# Patient Record
Sex: Female | Born: 1955 | Race: White | Hispanic: No | Marital: Married | Smoking: Former smoker
Health system: Southern US, Community
[De-identification: ages and names within clinical notes are randomized; demographics above are authoritative.]

## PROBLEM LIST (undated history)

## (undated) DIAGNOSIS — I252 Old myocardial infarction: Secondary | ICD-10-CM

## (undated) DIAGNOSIS — F419 Anxiety disorder, unspecified: Secondary | ICD-10-CM

## (undated) DIAGNOSIS — N289 Disorder of kidney and ureter, unspecified: Secondary | ICD-10-CM

## (undated) HISTORY — PX: LITHOTRIPSY: SUR834

## (undated) HISTORY — PX: OTHER SURGICAL HISTORY: SHX169

## (undated) HISTORY — PX: CORONARY ANGIOPLASTY WITH STENT PLACEMENT: SHX49

---

## 2014-02-26 ENCOUNTER — Encounter (HOSPITAL_COMMUNITY): Payer: Self-pay | Admitting: *Deleted

## 2014-02-26 ENCOUNTER — Inpatient Hospital Stay (HOSPITAL_COMMUNITY)
Admission: EM | Admit: 2014-02-26 | Discharge: 2014-03-02 | DRG: 193 | Disposition: A | Discharge status: Home / Self Care | Payer: 59 | Attending: Family Medicine | Admitting: Family Medicine

## 2014-02-26 ENCOUNTER — Emergency Department (HOSPITAL_COMMUNITY): Payer: 59

## 2014-02-26 ENCOUNTER — Inpatient Hospital Stay (HOSPITAL_COMMUNITY): Payer: 59

## 2014-02-26 DIAGNOSIS — F419 Anxiety disorder, unspecified: Secondary | ICD-10-CM | POA: Diagnosis present

## 2014-02-26 DIAGNOSIS — R112 Nausea with vomiting, unspecified: Secondary | ICD-10-CM | POA: Diagnosis present

## 2014-02-26 DIAGNOSIS — Z87891 Personal history of nicotine dependence: Secondary | ICD-10-CM | POA: Diagnosis not present

## 2014-02-26 DIAGNOSIS — Z87442 Personal history of urinary calculi: Secondary | ICD-10-CM

## 2014-02-26 DIAGNOSIS — I252 Old myocardial infarction: Secondary | ICD-10-CM | POA: Diagnosis not present

## 2014-02-26 DIAGNOSIS — J9601 Acute respiratory failure with hypoxia: Secondary | ICD-10-CM | POA: Diagnosis present

## 2014-02-26 DIAGNOSIS — R05 Cough: Secondary | ICD-10-CM

## 2014-02-26 DIAGNOSIS — E876 Hypokalemia: Secondary | ICD-10-CM

## 2014-02-26 DIAGNOSIS — E860 Dehydration: Secondary | ICD-10-CM | POA: Diagnosis present

## 2014-02-26 DIAGNOSIS — J189 Pneumonia, unspecified organism: Secondary | ICD-10-CM | POA: Diagnosis present

## 2014-02-26 DIAGNOSIS — H109 Unspecified conjunctivitis: Secondary | ICD-10-CM

## 2014-02-26 DIAGNOSIS — R059 Cough, unspecified: Secondary | ICD-10-CM

## 2014-02-26 HISTORY — DX: Disorder of kidney and ureter, unspecified: N28.9

## 2014-02-26 HISTORY — DX: Anxiety disorder, unspecified: F41.9

## 2014-02-26 HISTORY — DX: Old myocardial infarction: I25.2

## 2014-02-26 LAB — CBC WITH DIFFERENTIAL/PLATELET
BASOS ABS: 0.1 10*3/uL (ref 0.0–0.1)
BASOS PCT: 1 % (ref 0–1)
EOS ABS: 0 10*3/uL (ref 0.0–0.7)
Eosinophils Relative: 0 % (ref 0–5)
HEMATOCRIT: 42.5 % (ref 36.0–46.0)
Hemoglobin: 13.8 g/dL (ref 12.0–15.0)
Lymphocytes Relative: 11 % — ABNORMAL LOW (ref 12–46)
Lymphs Abs: 1.2 10*3/uL (ref 0.7–4.0)
MCH: 28.5 pg (ref 26.0–34.0)
MCHC: 32.5 g/dL (ref 30.0–36.0)
MCV: 87.8 fL (ref 78.0–100.0)
MONOS PCT: 19 % — AB (ref 3–12)
Monocytes Absolute: 2 10*3/uL — ABNORMAL HIGH (ref 0.1–1.0)
NEUTROS ABS: 7.3 10*3/uL (ref 1.7–7.7)
Neutrophils Relative %: 69 % (ref 43–77)
PLATELETS: 275 10*3/uL (ref 150–400)
RBC: 4.84 MIL/uL (ref 3.87–5.11)
RDW: 13 % (ref 11.5–15.5)
WBC Morphology: INCREASED
WBC: 10.6 10*3/uL — AB (ref 4.0–10.5)

## 2014-02-26 LAB — COMPREHENSIVE METABOLIC PANEL
ALBUMIN: 3.2 g/dL — AB (ref 3.5–5.2)
ALT: 42 U/L — ABNORMAL HIGH (ref 0–35)
AST: 33 U/L (ref 0–37)
Alkaline Phosphatase: 116 U/L (ref 39–117)
Anion gap: 10 (ref 5–15)
BUN: 9 mg/dL (ref 6–23)
CO2: 28 mmol/L (ref 19–32)
CREATININE: 0.82 mg/dL (ref 0.50–1.10)
Calcium: 8.8 mg/dL (ref 8.4–10.5)
Chloride: 102 mEq/L (ref 96–112)
GFR, EST NON AFRICAN AMERICAN: 78 mL/min — AB (ref >90)
GLUCOSE: 118 mg/dL — AB (ref 70–99)
POTASSIUM: 3 mmol/L — AB (ref 3.5–5.1)
Sodium: 140 mmol/L (ref 135–145)
Total Bilirubin: 0.5 mg/dL (ref 0.3–1.2)
Total Protein: 7.7 g/dL (ref 6.0–8.3)

## 2014-02-26 LAB — LIPASE, BLOOD: Lipase: 19 U/L (ref 11–59)

## 2014-02-26 MED ORDER — DEXTROSE 5 % IV SOLN: 500.0000 mg | Freq: Once | INTRAVENOUS | Status: DC

## 2014-02-26 MED ORDER — SODIUM CHLORIDE 0.9 % IV SOLN
INTRAVENOUS | Status: DC
  Administered 2014-02-26: 22:00:00 via INTRAVENOUS

## 2014-02-26 MED ORDER — DULOXETINE HCL 60 MG PO CPEP
60.0000 mg | ORAL_CAPSULE | Freq: Every day | ORAL | Status: DC
  Administered 2014-02-27 – 2014-03-02 (×4): 60 mg via ORAL
  Filled 2014-02-26 (×4): qty 1

## 2014-02-26 MED ORDER — IOHEXOL 300 MG/ML  SOLN
80.0000 mL | Freq: Once | INTRAMUSCULAR | Status: AC | PRN
  Administered 2014-02-26: 80 mL via INTRAVENOUS

## 2014-02-26 MED ORDER — ACETAMINOPHEN 325 MG PO TABS
650.0000 mg | ORAL_TABLET | Freq: Four times a day (QID) | ORAL | Status: DC | PRN
  Administered 2014-03-02: 650 mg via ORAL
  Filled 2014-02-26: qty 2

## 2014-02-26 MED ORDER — PROMETHAZINE HCL 25 MG RE SUPP: 25.0000 mg | Freq: Three times a day (TID) | RECTAL | Status: DC | PRN

## 2014-02-26 MED ORDER — POTASSIUM CHLORIDE 10 MEQ/100ML IV SOLN
10.0000 meq | INTRAVENOUS | Status: AC
  Administered 2014-02-26 – 2014-02-27 (×3): 10 meq via INTRAVENOUS
  Filled 2014-02-26 (×3): qty 100

## 2014-02-26 MED ORDER — TRAZODONE HCL 50 MG PO TABS
100.0000 mg | ORAL_TABLET | Freq: Every day | ORAL | Status: DC
  Administered 2014-02-27 – 2014-03-01 (×3): 100 mg via ORAL
  Filled 2014-02-26 (×4): qty 2

## 2014-02-26 MED ORDER — DEXTROSE 5 % IV SOLN: 1.0000 g | INTRAVENOUS | Status: DC

## 2014-02-26 MED ORDER — BUDESONIDE 0.25 MG/2ML IN SUSP
0.2500 mg | Freq: Every day | RESPIRATORY_TRACT | Status: DC
Start: 2014-02-27 — End: 2014-02-28
  Administered 2014-02-28: 0.25 mg via RESPIRATORY_TRACT
  Filled 2014-02-26 (×4): qty 2

## 2014-02-26 MED ORDER — SODIUM CHLORIDE 0.9 % IV BOLUS (SEPSIS)
1000.0000 mL | Freq: Once | INTRAVENOUS | Status: AC
  Administered 2014-02-26: 1000 mL via INTRAVENOUS

## 2014-02-26 MED ORDER — DEXTROSE 5 % IV SOLN
1.0000 g | Freq: Once | INTRAVENOUS | Status: AC
  Administered 2014-02-26: 1 g via INTRAVENOUS
  Filled 2014-02-26: qty 10

## 2014-02-26 MED ORDER — FLUTICASONE PROPIONATE HFA 110 MCG/ACT IN AERO: 2.0000 | INHALATION_SPRAY | Freq: Every day | RESPIRATORY_TRACT | Status: DC

## 2014-02-26 MED ORDER — ENSURE COMPLETE PO LIQD
237.0000 mL | Freq: Two times a day (BID) | ORAL | Status: DC
  Administered 2014-03-02: 237 mL via ORAL

## 2014-02-26 MED ORDER — DEXTROSE 5 % IV SOLN
500.0000 mg | INTRAVENOUS | Status: DC
  Administered 2014-02-27 – 2014-03-01 (×4): 500 mg via INTRAVENOUS
  Filled 2014-02-26 (×4): qty 500

## 2014-02-26 MED ORDER — SIMVASTATIN 20 MG PO TABS
40.0000 mg | ORAL_TABLET | Freq: Every day | ORAL | Status: DC
  Administered 2014-02-27 – 2014-03-01 (×3): 40 mg via ORAL
  Filled 2014-02-26 (×4): qty 2

## 2014-02-26 MED ORDER — ACETAMINOPHEN 650 MG RE SUPP
650.0000 mg | Freq: Once | RECTAL | Status: AC
  Administered 2014-02-26: 650 mg via RECTAL
  Filled 2014-02-26: qty 1

## 2014-02-26 MED ORDER — ONDANSETRON HCL 4 MG/2ML IJ SOLN
4.0000 mg | Freq: Once | INTRAMUSCULAR | Status: AC
  Administered 2014-02-26: 4 mg via INTRAVENOUS
  Filled 2014-02-26: qty 2

## 2014-02-26 MED ORDER — DULOXETINE HCL 60 MG PO CPEP: 60.0000 mg | ORAL_CAPSULE | Freq: Every day | ORAL | Status: DC

## 2014-02-26 MED ORDER — SIMVASTATIN 20 MG PO TABS
40.0000 mg | ORAL_TABLET | Freq: Every day | ORAL | Status: DC
  Administered 2014-02-27: 40 mg via ORAL

## 2014-02-26 MED ORDER — BUPROPION HCL ER (XL) 300 MG PO TB24
300.0000 mg | ORAL_TABLET | Freq: Every day | ORAL | Status: DC
  Administered 2014-02-27 – 2014-03-02 (×4): 300 mg via ORAL
  Filled 2014-02-26 (×6): qty 1

## 2014-02-26 MED ORDER — CEFTRIAXONE SODIUM IN DEXTROSE 20 MG/ML IV SOLN
1.0000 g | INTRAVENOUS | Status: DC
  Administered 2014-02-27 – 2014-03-01 (×3): 1 g via INTRAVENOUS
  Filled 2014-02-26 (×4): qty 50

## 2014-02-26 MED ORDER — HEPARIN SODIUM (PORCINE) 5000 UNIT/ML IJ SOLN
5000.0000 [IU] | Freq: Three times a day (TID) | INTRAMUSCULAR | Status: DC
  Administered 2014-02-27 – 2014-03-02 (×12): 5000 [IU] via SUBCUTANEOUS
  Filled 2014-02-26 (×12): qty 1

## 2014-02-26 NOTE — ED Notes (Signed)
Dr Gosrani at bedside. 

## 2014-02-26 NOTE — ED Notes (Signed)
Pt's O2 sats dropped to 86%. Pt HOB raised and told to take deep breaths. O2 raised to 88%. Pt placed on 2L O2 via nasal cannula. Pt O2 at 93%. EDP made aware.

## 2014-02-26 NOTE — ED Notes (Signed)
MD at bedside. 

## 2014-02-26 NOTE — H&P (Signed)
[facility] History and Physical  [name] WUJ:[id number] DOB: 1955/12/03 DOA: 02/26/2014  Referring physician: ER PCP: No primary care provider on file.   Chief Complaint: Cough, weakness.  HPI: [name] is a 59 y.o. female  This is a 59 year old lady who gives a one-week history of cough productive of green/yellow sputum associated with fever. She also has had nausea and vomiting for the last few days without abdominal pain. She lives in [place] and is visiting her mother here. She says that her symptoms began with an upper respiratory tract viral type of illness. She does not feel dyspneic but she was noted to have desaturations on air with minimal exertion in the emergency room. She is an ex-smoker. Her chest x-ray is abnormal and she is now being admitted for further management.   Review of Systems:  Apart from symptoms above, all systems negative.   Past Medical History  Diagnosis Date  . MI, old   . Anxiety   . Renal disorder     kidney stones   Past Surgical History  Procedure Laterality Date  . Coronary angioplasty with stent placement    . Lithotripsy    . Kidney stone extraction     Social History:  reports that she has quit smoking. She does not have any smokeless tobacco history on file. She reports that she drinks alcohol. Her drug history is not on file.  Allergies  Allergen Reactions  . Erythromycin Nausea And Vomiting    Family history: No history of chronic lung disease in the family.    Prior to Admission medications   Medication Sig Start Date End Date Taking? Authorizing Provider  azithromycin (ZITHROMAX) 250 MG tablet Take 250-500 mg by mouth daily. Started on 02/25/14   Yes Historical Provider, MD  buPROPion (WELLBUTRIN XL) 300 MG 24 hr tablet Take 300 mg by mouth daily.   Yes Historical Provider, MD  DULoxetine (CYMBALTA) 60 MG capsule Take 60 mg by mouth daily.   Yes Historical Provider, MD  FLOVENT HFA 110 MCG/ACT inhaler Inhale 2  puffs into the lungs daily. 01/27/14  Yes Historical Provider, MD  promethazine (PHENERGAN) 25 MG suppository Place 25 mg rectally every 8 (eight) hours as needed for nausea or vomiting.   Yes Historical Provider, MD  simvastatin (ZOCOR) 40 MG tablet Take 40 mg by mouth daily.   Yes Historical Provider, MD  traZODone (DESYREL) 100 MG tablet Take 100 mg by mouth at bedtime.   Yes Historical Provider, MD   Physical Exam: Filed Vitals:   02/26/14 1716 02/26/14 1850 02/26/14 2011 02/26/14 2030  BP: 165/85  146/83 148/86  Pulse: 110  110 113  Temp: 102 F (38.9 Ryan) 100.6 F (38.1 Ryan) 98.6 F (37 Ryan) 98.5 F (36.9 Ryan)  TempSrc: Oral Oral Oral Oral  Resp: Height:  (1.626 m)     Weight: 68.04 kg (150 lb)     SpO2: 97%  86% 93%    Wt Readings from Last 3 Encounters:  02/26/14 68.04 kg (150 lb)    General:  Appears calm and comfortable. Anxious. Flushed with fever.  Eyes: PERRL, normal lids, irises & conjunctiva ENT: grossly normal hearing, lips & tongue Neck: no LAD, masses or thyromegaly Cardiovascular: RRR, no m/r/g. No LE edema. Telemetry: SR, no arrhythmias  Respiratory:Bilateral scattered crackles. No bronchial breathing or wheezing.  Abdomen: soft, ntnd Skin: no rash or induration seen on limited exam Musculoskeletal: grossly normal tone BUE/BLE Psychiatric: grossly normal  mood and affect, speech fluent and appropriate Neurologic: grossly non-focal.          Labs on Admission:  Basic Metabolic Panel:  Recent Labs Lab 02/26/14 1800  NA 140  K 3.0*  CL 102  CO2 28  GLUCOSE 118*  BUN 9  CREATININE 0.82  CALCIUM 8.8   Liver Function Tests:  Recent Labs Lab 02/26/14 1800  AST 33  ALT 42*  ALKPHOS 116  BILITOT 0.5  PROT 7.7  ALBUMIN 3.2*    Recent Labs Lab 02/26/14 1800  LIPASE 19   No results for input(s): AMMONIA in the last 168 hours. CBC:  Recent Labs Lab 02/26/14 1800  WBC 10.6*  NEUTROABS 7.3  HGB 13.8  HCT 42.5  MCV 87.8    PLT 275   Cardiac Enzymes: No results for input(s): CKTOTAL, CKMB, CKMBINDEX, TROPONINI in the last 168 hours.  BNP (last 3 results) No results for input(s): PROBNP in the last 8760 hours. CBG: No results for input(s): GLUCAP in the last 168 hours.  Radiological Exams on Admission: Dg Chest 2 View  02/26/2014   CLINICAL DATA:  Weakness, vomiting and fever.  EXAM: CHEST  2 VIEW  COMPARISON:  PA and lateral chest 02/25/2014.  FINDINGS: Reticulonodular opacities are seen throughout both lungs. No consolidative process is identified. Mild peribronchial thickening is seen. No focal airspace disease, pneumothorax or effusion is identified. Heart size is normal.  IMPRESSION: Reticulonodular opacities suggestive atypical infection with bronchitic change also identified.   Electronically Signed   By: Drusilla Kannerhomas  Dalessio M.D.   On: 02/26/2014 19:34      Assessment/Plan  1. Community-acquired pneumonia. The chest x-ray shows reticulonodular opacities, suggestive of atypical infection. I'm going to obtain a CT chest scan to further evaluate this. In the meantime, she will be treated with intravenous antibiotics and supplemental oxygen as required.  Further recommendations will depend on patient's hospital progress.  Code Status:  Full code.   DVT Prophylaxis: Heparin.  Disposition  home when medically stable.   Time spent  45 minutes. [name] SingerGOSRANI,[name] [facility] Pager [phone]

## 2014-02-26 NOTE — ED Notes (Signed)
Der verbal order by Dr. Adriana Sim[place]ook, first bolus of NS started, if pt unable to urinate will give second bolus.

## 2014-02-26 NOTE — ED Notes (Signed)
Vomiting, fever, productive cough (green and yellow), headache, nasal congestion. Last vomited at 0630 this morning. Current fever of 102. Pt took phenergan this morning.

## 2014-02-26 NOTE — ED Notes (Signed)
Pt using bedside commode. Reported having a BM as well thus contaminating urine sample. Will attempt to collect at later time. EDP made aware.

## 2014-02-26 NOTE — ED Notes (Signed)
JAH put the patient on 2L. SPO2 is 91%.

## 2014-02-26 NOTE — ED Provider Notes (Addendum)
CSN: [id number]     Arrival date & time 02/26/14  1715 History   First MD Initiated Contact with Patient 02/26/14 1717     Chief Complaint  Patient presents with  . Emesis     (Consider location/radiation/quality/duration/timing/severity/associated sxs/prior Treatment) HPI... Nausea and vomiting starting Sunday followed by productive green-yellow cough with fever. Patient was seen by an urgent care center yesterday. She was diagnosed with a "sinus infection and viral infection".  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 She lives in [place] and is visiting her mother here. Review systems positive for conjunctivitis of the right eye. Severity is moderate. Patient becomes very dyspneic with exertion.   Past Medical History  Diagnosis Date  . MI, old   . Anxiety   . Renal disorder     kidney stones   Past Surgical History  Procedure Laterality Date  . Coronary angioplasty with stent placement    . Lithotripsy    . Kidney stone extraction     No family history on file. History  Substance Use Topics  . Smoking status: Former Games developer  . Smokeless tobacco: Not on file  . Alcohol Use: Yes     Comment: Occ   OB History    No data available     Review of Systems  All other systems reviewed and are negative.     Allergies  Erythromycin  Home Medications   Prior to Admission medications   Medication Sig Start Date End Date Taking? Authorizing Provider  azithromycin (ZITHROMAX) 250 MG tablet Take 250-500 mg by mouth daily. Started on 02/25/14   Yes Historical Provider, MD  buPROPion (WELLBUTRIN XL) 300 MG 24 hr tablet Take 300 mg by mouth daily.   Yes Historical Provider, MD  DULoxetine (CYMBALTA) 60 MG capsule Take 60 mg by mouth daily.   Yes Historical Provider, MD  FLOVENT HFA 110 MCG/ACT inhaler Inhale 2 puffs into the lungs daily. 01/27/14  Yes Historical Provider, MD  promethazine (PHENERGAN) 25 MG suppository Place 25 mg rectally every 8 (eight) hours as needed for  nausea or vomiting.   Yes Historical Provider, MD  simvastatin (ZOCOR) 40 MG tablet Take 40 mg by mouth daily.   Yes Historical Provider, MD  traZODone (DESYREL) 100 MG tablet Take 100 mg by mouth at bedtime.   Yes Historical Provider, MD   BP 148/86 mmHg  Pulse 113  Temp(Src) 98.5 F (36.9 C) (Oral)  Resp 20  Ht  (1.626 m)  Wt 150 lb (68.04 kg)  BMI 25.73 kg/m2  SpO2 93% Physical Exam  Constitutional: She is oriented to person, place, and time.  Pale, dehydrated  HENT:  Head: Normocephalic and atraumatic.  Eyes: Conjunctivae and EOM are normal. Pupils are equal, round, and reactive to light.  Neck: Normal range of motion. Neck supple.  Cardiovascular: Normal rate and regular rhythm.   Pulmonary/Chest: Effort normal.  Scattered rhonchi  Abdominal: Soft. Bowel sounds are normal.  Musculoskeletal: Normal range of motion.  Neurological: She is alert and oriented to person, place, and time.  Skin: Skin is warm and dry.  Psychiatric: She has a normal mood and affect. Her behavior is normal.  Nursing note and vitals reviewed.   ED Course  Procedures (including critical care time) Labs Review Labs Reviewed  COMPREHENSIVE METABOLIC PANEL - Abnormal; Notable for the following:    Potassium 3.0 (*)    Glucose, Bld 118 (*)    Albumin 3.2 (*)    ALT 42 (*)  GFR calc non Af Amer 78 (*)    All other components within normal limits  CBC WITH DIFFERENTIAL - Abnormal; Notable for the following:    WBC 10.6 (*)    Lymphocytes Relative 11 (*)    Monocytes Relative 19 (*)    Monocytes Absolute 2.0 (*)    All other components within normal limits  LIPASE, BLOOD  URINALYSIS, ROUTINE W REFLEX MICROSCOPIC    Imaging Review Dg Chest 2 View  02/26/2014   CLINICAL DATA:  Weakness, vomiting and fever.  EXAM: CHEST  2 VIEW  COMPARISON:  PA and lateral chest 02/25/2014.  FINDINGS: Reticulonodular opacities are seen throughout both lungs. No consolidative process is identified. Mild  peribronchial thickening is seen. No focal airspace disease, pneumothorax or effusion is identified. Heart size is normal.  IMPRESSION: Reticulonodular opacities suggestive atypical infection with bronchitic change also identified.   Electronically Signed   By: Drusilla Kannerhomas  Dalessio M.D.   On: 02/26/2014 19:34     EKG Interpretation None     CRITICAL CARE Performed by: Donnetta HutchingOOK,Shauntay Brunelli Total critical care time: 30 Critical care time was exclusive of separately billable procedures and treating other patients. Critical care was necessary to treat or prevent imminent or life-threatening deterioration. Critical care was time spent personally by me on the following activities: development of treatment plan with patient and/or surrogate as well as nursing, discussions with consultants, evaluation of patient's response to treatment, examination of patient, obtaining history from patient or surrogate, ordering and performing treatments and interventions, ordering and review of laboratory studies, ordering and review of radiographic studies, pulse oximetry and re-evaluation of patient's condition.  MDM   Final diagnoses:  Cough  Community acquired pneumonia  Dehydration    Patient has required greater than 3 L of IV fluids. Chest x-ray is suggestive of an atypical infection. Patient desaturates with exertion. IV Zithromax. IV Rocephin. Admit to general medicine.    Donnetta HutchingBrian Roosevelt Eimers, MD 02/26/14 2049  Donnetta HutchingBrian Adelai Achey, MD 02/26/14 [phone]

## 2014-02-27 DIAGNOSIS — J9601 Acute respiratory failure with hypoxia: Secondary | ICD-10-CM

## 2014-02-27 DIAGNOSIS — R112 Nausea with vomiting, unspecified: Secondary | ICD-10-CM

## 2014-02-27 DIAGNOSIS — E876 Hypokalemia: Secondary | ICD-10-CM

## 2014-02-27 DIAGNOSIS — H109 Unspecified conjunctivitis: Secondary | ICD-10-CM

## 2014-02-27 LAB — INFLUENZA PANEL BY PCR (TYPE A & B)
H1N1 flu by pcr: NOT DETECTED
Influenza A By PCR: NEGATIVE
Influenza B By PCR: NEGATIVE

## 2014-02-27 LAB — URINALYSIS, ROUTINE W REFLEX MICROSCOPIC
BILIRUBIN URINE: NEGATIVE
Glucose, UA: NEGATIVE mg/dL
Ketones, ur: 15 mg/dL — AB
Leukocytes, UA: NEGATIVE
Nitrite: NEGATIVE
PH: 6 (ref 5.0–8.0)
Protein, ur: NEGATIVE mg/dL
Specific Gravity, Urine: 1.01 (ref 1.005–1.030)
UROBILINOGEN UA: 0.2 mg/dL (ref 0.0–1.0)

## 2014-02-27 LAB — CBC
HEMATOCRIT: 37.9 % (ref 36.0–46.0)
Hemoglobin: 12.5 g/dL (ref 12.0–15.0)
MCH: 29.1 pg (ref 26.0–34.0)
MCHC: 33 g/dL (ref 30.0–36.0)
MCV: 88.3 fL (ref 78.0–100.0)
PLATELETS: 259 10*3/uL (ref 150–400)
RBC: 4.29 MIL/uL (ref 3.87–5.11)
RDW: 13.2 % (ref 11.5–15.5)
WBC: 12.5 10*3/uL — ABNORMAL HIGH (ref 4.0–10.5)

## 2014-02-27 LAB — COMPREHENSIVE METABOLIC PANEL
ALT: 35 U/L (ref 0–35)
AST: 34 U/L (ref 0–37)
Albumin: 2.3 g/dL — ABNORMAL LOW (ref 3.5–5.2)
Alkaline Phosphatase: 89 U/L (ref 39–117)
Anion gap: 6 (ref 5–15)
CO2: 25 mmol/L (ref 19–32)
Calcium: 7.6 mg/dL — ABNORMAL LOW (ref 8.4–10.5)
Chloride: 110 mEq/L (ref 96–112)
Creatinine, Ser: 0.74 mg/dL (ref 0.50–1.10)
GFR calc Af Amer: >90 mL/min (ref >90)
Glucose, Bld: 107 mg/dL — ABNORMAL HIGH (ref 70–99)
Potassium: 2.8 mmol/L — ABNORMAL LOW (ref 3.5–5.1)
Sodium: 141 mmol/L (ref 135–145)
TOTAL PROTEIN: 5.8 g/dL — AB (ref 6.0–8.3)
Total Bilirubin: 0.5 mg/dL (ref 0.3–1.2)

## 2014-02-27 LAB — CLOSTRIDIUM DIFFICILE BY PCR: Toxigenic C. Difficile by PCR: NEGATIVE

## 2014-02-27 LAB — MAGNESIUM: Magnesium: 1.6 mg/dL (ref 1.5–2.5)

## 2014-02-27 LAB — URINE MICROSCOPIC-ADD ON

## 2014-02-27 LAB — STREP PNEUMONIAE URINARY ANTIGEN: STREP PNEUMO URINARY ANTIGEN: NEGATIVE

## 2014-02-27 MED ORDER — AZITHROMYCIN 500 MG IV SOLR
INTRAVENOUS | Status: AC
  Filled 2014-02-27: qty 500

## 2014-02-27 MED ORDER — POLYMYXIN B-TRIMETHOPRIM 10000-0.1 UNIT/ML-% OP SOLN
1.0000 [drp] | Freq: Four times a day (QID) | OPHTHALMIC | Status: DC
  Administered 2014-02-27 – 2014-03-02 (×13): 1 [drp] via OPHTHALMIC
  Filled 2014-02-27: qty 10

## 2014-02-27 NOTE — Progress Notes (Signed)
PROGRESS NOTE  [name] PillowJulia Bonnet ZOX:[id number] DOB: 09/18/1955 DOA: 02/26/2014 PCP: No primary care provider on file. from [place]  Summary: 59 year old woman with approximately one week history of productive cough, fever, nausea and vomiting for several days. Admitted for acute hypoxic respiratory failure and atypical pneumonia.  Assessment/Plan: 1. Acute hypoxic respiratory failure secondary to community-acquired pneumonia. 2. CAP, h/o yearly bronchitis. CT chest suggested atypical infection. Cultures and screening HIV pending.  3. N/V, resolved. 4. Hypokalemia  5. Right eye conjunctivitis without complicating features. 6. 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   Some improvement. Continue empiric antibiotics. Wean oxygen as tolerated.  Replete potassium  Antibiotic eyedrops  Code Status: full code DVT prophylaxis: heparin Family Communication: none present Disposition Plan: home  Brendia Sacksaniel Goodrich, MD  [facility]  Pager [phone] If 7PM-7AM, please contact night-coverage at [email or web address], password [email or web address] 02/27/2014, 9:25 AM  LOS: 1 day   Consultants:    Procedures:    Antibiotics:  Azithromycin 1/21 >>  Ceftriaxone 1/21 >>  HPI/Subjective: Feels better. Breathing better but still short of breath and coughing. Vomiting and nausea have resolved. No abdominal pain. Appetite poor. Headache has resolved. Does have some matting of the right eye, no right eye pain or visual changes. Vision intact.  Objective: Filed Vitals:   02/26/14 2011 02/26/14 2030 02/26/14 2300 02/27/14 0500  BP: 146/83 148/86 149/82 153/77  Pulse: 110 113 111 100  Temp: 98.6 F (37 C) 98.5 F (36.9 C) 98.7 F (37.1 C) 98.5 F (36.9 C)  TempSrc: Oral Oral Oral Oral  Resp: 19 20 20 20   Height:   5\' 4"  (1.626 m)   Weight:   69 kg (152 lb 1.9 oz)   SpO2: 86% 93% 94% 96%    Intake/Output Summary (Last 24 hours) at 02/27/14 0925 Last data filed at 02/27/14 0538  Gross per 24 hour  Intake  2458.33 ml  Output      0 ml  Net 2458.33 ml     Filed Weights   02/26/14 1716 02/26/14 2300  Weight: 68.04 kg (150 lb) 69 kg (152 lb 1.9 oz)    Exam:     Afebrile, vital signs stable. 96% on 2 L. General:  Appears calm and mildly uncomfortable Eyes: Right sclera injected, some exudate. Extraocular movement intact. Pupil round and reactive. Lids appear unremarkable. Nontender. Left eye appears grossly unremarkable. Cardiovascular: RRR, no m/r/g. No LE edema. Respiratory: Coarse breath sounds bilaterally without frank wheezes or rales. Mild increased respiratory effort. Able to speak in full sentences. Psychiatric: grossly normal mood and affect, speech fluent and appropriate  Data Reviewed:  Complete metabolic panel notable for potassium 2.8. Magnesium normal.  CBC stable, WBC 12.5.  Pertinent data: Labs  Influenza PCR negative  Urinalysis negative Imaging   Chest x-ray suggested atypical infection  CT chest suggested atypical infection. Other    Pending data:    Scheduled Meds: . sodium chloride   Intravenous STAT  . azithromycin  500 mg Intravenous Q24H  . budesonide (PULMICORT) nebulizer solution  0.25 mg Nebulization Daily  . buPROPion  300 mg Oral Daily  . cefTRIAXone (ROCEPHIN)  IV  1 g Intravenous Q24H  . DULoxetine  60 mg Oral Daily  . feeding supplement (ENSURE COMPLETE)  237 mL Oral BID BM  . heparin  5,000 Units Subcutaneous 3 times per day  . simvastatin  40 mg Oral q1800  . traZODone  100 mg Oral QHS   Continuous Infusions:   Principal Problem:   Community acquired  pneumonia Active Problems:   Acute respiratory failure with hypoxia   Nausea and vomiting   Hypokalemia   Conjunctivitis   Time spent 20 minutes

## 2014-02-27 NOTE — Progress Notes (Signed)
Pt had 3 loose stools. Cdiff per PCR ordered.

## 2014-02-27 NOTE — Progress Notes (Signed)
Nutrition Brief Note  Patient identified on the Malnutrition Screening Tool (MST) Report. Pt from [place]. She presents with cough and weakness.   Wt Readings from Last 15 Encounters:  02/26/14 152 lb 1.9 oz (69 kg)    Body mass index is 26.1 kg/(m^2). Patient meets criteria for overeweight based on current BMI.   Current diet order is regular. Dietary staff obtaining food preferences to maximize meal acceptance. Labs and medications reviewed. Pt receiving Ensure Complete po BID, each supplement provides 350 kcal and 13 grams of protein.    Royann ShiversLynn Shynice Sigel MS,RD,CSG,LDN Office: [phone] Pager: [phone]

## 2014-02-27 NOTE — Progress Notes (Signed)
UR chart review completed.  

## 2014-02-27 NOTE — Care Management Note (Addendum)
    Page 1 of 2   03/02/2014     3:25:17 PM CARE MANAGEMENT NOTE 03/02/2014  Patient:  [name] Ryan   Account Number:  [id number]  Date Initiated:  02/27/2014  Documentation initiated by:  Sharrie RothmanBLACKWELL,Sergei Delo C  Subjective/Objective Assessment:   Pt admitted from home with pneumonia. Pt lives with her husband in [name] and will return home at discharge. Pt is independent with ADL's.     Action/Plan:   No CM needs noted.   Anticipated DC Date:  03/02/2014   Anticipated DC Plan:  HOME/SELF CARE      DC Planning Services  CM consult      PAC Choice  DURABLE MEDICAL EQUIPMENT   Choice offered to / List presented to:  C-1 Patient   DME arranged  [facility]      [facility]        Status of service:  Completed, signed off Medicare Important Message given?   (If response is "NO", the following Medicare IM given date fields will be blank) Date Medicare IM given:   Medicare IM given by:   Date Additional Medicare IM given:   Additional Medicare IM given by:    Discharge Disposition:  HOME/SELF CARE  Per UR Regulation:    If discussed at Long Length of Stay Meetings, dates discussed:    Comments:  03/02/14 1520 [name] Queenammy Viva Gallaher, RN BSN CM Pt discharged home today with home O2 with [facility]. Pt will be discharging to her mothers [place] until 03/05/14 and then pt will travel home to [name]. [facility] will coordinate with pt to arrange home O2 in [name]. Portable will be delivered to pt in hospital to discharge home with. Other equipment will be delivered to this pm. No other CM needs noted. Pt and pts nurse aware of discharge arrangements.  02/27/14 1105 [name] Dolinski RN BSN CM

## 2014-02-28 DIAGNOSIS — E876 Hypokalemia: Secondary | ICD-10-CM

## 2014-02-28 LAB — HIV ANTIBODY (ROUTINE TESTING W REFLEX)
HIV 1/O/2 Abs-Index Value: <1 (ref <1.00)
HIV-1/HIV-2 Ab: NONREACTIVE

## 2014-02-28 LAB — BASIC METABOLIC PANEL
Anion gap: 10 (ref 5–15)
BUN: <5 mg/dL — ABNORMAL LOW (ref 6–23)
CHLORIDE: 105 mmol/L (ref 96–112)
CO2: 26 mmol/L (ref 19–32)
Calcium: 8.2 mg/dL — ABNORMAL LOW (ref 8.4–10.5)
Creatinine, Ser: 0.8 mg/dL (ref 0.50–1.10)
GFR calc Af Amer: >90 mL/min (ref >90)
GFR, EST NON AFRICAN AMERICAN: 80 mL/min — AB (ref >90)
Glucose, Bld: 99 mg/dL (ref 70–99)
POTASSIUM: 2.7 mmol/L — AB (ref 3.5–5.1)
Sodium: 141 mmol/L (ref 135–145)

## 2014-02-28 LAB — MAGNESIUM: Magnesium: 1.8 mg/dL (ref 1.5–2.5)

## 2014-02-28 MED ORDER — GUAIFENESIN-DM 100-10 MG/5ML PO SYRP
5.0000 mL | ORAL_SOLUTION | ORAL | Status: DC | PRN
  Administered 2014-02-28 – 2014-03-02 (×4): 5 mL via ORAL
  Filled 2014-02-28 (×4): qty 5

## 2014-02-28 MED ORDER — ALBUTEROL SULFATE (2.5 MG/3ML) 0.083% IN NEBU: 2.5000 mg | INHALATION_SOLUTION | RESPIRATORY_TRACT | Status: DC | PRN

## 2014-02-28 MED ORDER — POTASSIUM CHLORIDE CRYS ER 20 MEQ PO TBCR
40.0000 meq | EXTENDED_RELEASE_TABLET | ORAL | Status: AC
  Administered 2014-02-28 (×4): 40 meq via ORAL
  Filled 2014-02-28 (×4): qty 2

## 2014-02-28 NOTE — Progress Notes (Signed)
PROGRESS NOTE  [name] Ryan [id number] DOB: 02/22/1955 DOA: 02/26/2014 PCP: No primary care provider on file. from [place]  Summary: 59 year old woman with approximately one week history of productive cough, fever, nausea and vomiting for several days. Admitted for acute hypoxic respiratory failure and atypical pneumonia.  Assessment/Plan: 1. Acute hypoxic respiratory failure secondary to community-acquired pneumonia, still hypoxic. 2. CAP, h/o yearly bronchitis. CT chest suggested atypical infection. Screening HIV negative. Has not been able to cough anything up, can't obtain sputum culture 3. N/V, resolved. Tolerating food. 4. Hypokalemia. Mg normal. 5. Right eye conjunctivitis without complicating features. Resolving rapidly. 6. 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   No significant change overall since yesterday, still hypoxic and SOB.  Continue empiric antibiotics. Wean oxygen as tolerated.  Replete potassium  Code Status: full code DVT prophylaxis: heparin Family Communication: none present Disposition Plan: home  Brendia Sacksaniel Janeliz Prestwood, MD  [facility]  Pager [phone] If 7PM-7AM, please contact night-coverage at [email or web address], password [email or web address] 02/28/2014, 9:08 AM  LOS: 2 days   Consultants:    Procedures:    Antibiotics:  Azithromycin 1/21 >>  Ceftriaxone 1/21 >>  HPI/Subjective: Poor sleep, lots of coughing, SOB; better since admission but not as good as yesterday. No n/v. No abdominal pain. Appetite has improved and ate breakfast this AM, first food in awhile. Some head fullness and sinus pain. Right eye seems better, was not matted shut this AM. No change in vision, no pain.  Objective: Filed Vitals:   02/27/14 1359 02/27/14 2013 02/28/14 0613 02/28/14 0745  BP: 130/66 155/77 112/57   Pulse: 102 101 93   Temp: 99.4 F (37.4 C) 98.3 F (36.8 C) 98.9 F (37.2 C)   TempSrc: Oral Oral Oral   Resp: 20 20 22    Height:      Weight:      SpO2: 96% 97%  94% 94%    Intake/Output Summary (Last 24 hours) at 02/28/14 0908 Last data filed at 02/28/14 0616  Gross per 24 hour  Intake    780 ml  Output   1450 ml  Net   -670 ml     Filed Weights   02/26/14 1716 02/26/14 2300  Weight: 68.04 kg (150 lb) 69 kg (152 lb 1.9 oz)    Exam:     Afebrile, vital signs stable. 94% on 2 L. General:  Appears calm and comfortable, ill but not toxic Eyes: right eye looks much better, minimal injection of sclera, no exudate, EOMI, pupil appears normal, lids appear normal Cardiovascular: RRR, no m/r/g. No LE edema. Telemetry: SR, no arrhythmias  Respiratory: coarse breath sounds with rhonchi and wheezes; able to speak in full sentences, mild increased respiratory effort. Psychiatric: grossly normal mood and affect, speech fluent and appropriate  Data Reviewed:  Mg normal. Potassium 2.7  CBC stable, WBC 12.5.  Pertinent data: Labs  Influenza PCR negative  Urinalysis negative Imaging   Chest x-ray suggested atypical infection  CT chest suggested atypical infection. Other    Pending data:    Scheduled Meds: . azithromycin  500 mg Intravenous Q24H  . budesonide (PULMICORT) nebulizer solution  0.25 mg Nebulization Daily  . buPROPion  300 mg Oral Daily  . cefTRIAXone (ROCEPHIN)  IV  1 g Intravenous Q24H  . DULoxetine  60 mg Oral Daily  . feeding supplement (ENSURE COMPLETE)  237 mL Oral BID BM  . heparin  5,000 Units Subcutaneous 3 times per day  . simvastatin  40 mg Oral q1800  . traZODone  100 mg Oral QHS  . trimethoprim-polymyxin b  1 drop Right Eye 4 times per day   Continuous Infusions:   Principal Problem:   Community acquired pneumonia Active Problems:   Acute respiratory failure with hypoxia   Nausea and vomiting   Hypokalemia   Conjunctivitis   Time spent 20 minutes

## 2014-02-28 NOTE — Progress Notes (Signed)
Patient's has critical lab K 2.7, Dr. Irene LimboGoodrich notified.

## 2014-02-28 NOTE — Progress Notes (Signed)
Pt c/o cough. Mid level contacted. Order for [place] DM ordered.

## 2014-03-01 LAB — BASIC METABOLIC PANEL
Anion gap: 8 (ref 5–15)
BUN: 6 mg/dL (ref 6–23)
CO2: 28 mmol/L (ref 19–32)
CREATININE: 0.77 mg/dL (ref 0.50–1.10)
Calcium: 8.6 mg/dL (ref 8.4–10.5)
Chloride: 107 mmol/L (ref 96–112)
GFR calc Af Amer: >90 mL/min (ref >90)
GFR calc non Af Amer: >90 mL/min (ref >90)
GLUCOSE: 107 mg/dL — AB (ref 70–99)
POTASSIUM: 4.1 mmol/L (ref 3.5–5.1)
Sodium: 143 mmol/L (ref 135–145)

## 2014-03-01 LAB — CBC
HCT: 35.8 % — ABNORMAL LOW (ref 36.0–46.0)
Hemoglobin: 11.7 g/dL — ABNORMAL LOW (ref 12.0–15.0)
MCH: 29 pg (ref 26.0–34.0)
MCHC: 32.7 g/dL (ref 30.0–36.0)
MCV: 88.6 fL (ref 78.0–100.0)
Platelets: 310 10*3/uL (ref 150–400)
RBC: 4.04 MIL/uL (ref 3.87–5.11)
RDW: 13.2 % (ref 11.5–15.5)
WBC: 15.1 10*3/uL — AB (ref 4.0–10.5)

## 2014-03-01 NOTE — Progress Notes (Signed)
Patient ambulated the hall approximately 100 ft. On room air at rest, oxygen saturations were 94%. While ambulating saturations varied from 71%-97%. Patient did become short of breath and had to rest while in the hall. Saturations increased to 96% and patient was assisted back to room. Nasal cannula was replaced due to shortness of breath. No further complaints. Patient resting in bed. Will continue to monitor.

## 2014-03-01 NOTE — Progress Notes (Signed)
PROGRESS NOTE  [name] Ryan ZOX:[id number] DOB: 12/14/1955 DOA: 02/26/2014 PCP: [place] primary care [place] file[place]  Summary: 59 year old woman with approximately one week history of productive cough, fever, nausea and vomiting for several days. Admitted for acute hypoxic respiratory failure and atypical pneumonia.  Assessment/Plan: 1. Acute hypoxic respiratory failure secondary to community-acquired pneumonia, hypoxia stable. 2. CAP, h/o yearly bronchitis. CT chest suggested atypical infection. Screening HIV negative. Slow to improve, still hypoxic. Has not been able to cough anything up, can't obtain sputum culture 3. N/V, resolved. Eating well. 4. Hypokalemia, resolved. Mg normal. 5. Right eye conjunctivitis without complicating features. Appears resolved. 6. 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   Overall slow improvement though still hypoxic, will likely need home oxygen.  Continue abx, wean oxygen as tolerated, walk daily  Discontinue telemetry  Code Status: full code DVT prophylaxis: heparin Family Communication: none present Disposition Plan: home  Brendia Sacksaniel Goodrich, MD  [facility]  Pager [phone] If 7PM-7AM, please contact night-coverage at [email or web address], password [email or web address] 03/01/2014, 1:40 PM  LOS: 3 days   Consultants:    Procedures:    Antibiotics:  Azithromycin 1/21 >> 1/25  Ceftriaxone 1/21 >>  HPI/Subjective: Feeling a little better today. Brief nausea last night. No vomiting. Eating better. Eye seems to be improving, no visual disturbance and no pain. Still SOB with exertion to bathroom.  Objective: Filed Vitals:   02/28/14 [id number] 02/28/14 0745 02/28/14 2254 03/01/14 0547  BP: 112/57  114/60 106/60  Pulse: 93  102 94  Temp: 98.9 F (37.2 C)  100.3 F (37.9 C) 98.7 F (37.1 C)  TempSrc: Oral  Oral Oral  Resp: 22  20 18   Height:      Weight:      SpO2: 94% 94% 94% 92%    Intake/Output Summary (Last 24 hours) at 03/01/14  1340 Last data filed at 02/28/14 1610  Gross per 24 hour  Intake      0 ml  Output    600 ml  Net   -600 ml     Filed Weights   02/26/14 1716 02/26/14 2300  Weight: 68.04 kg (150 lb) 69 kg (152 lb 1.9 oz)    Exam:     Afebrile, Tm 100.3, vital signs stable. 92% on 2 L. General:  Appears comfortable, calm. Cardiovascular: Regular rate and rhythm, no murmur, rub or gallop. No lower extremity edema. Telemetry: Sinus rhythm, no arrhythmias  Respiratory: coarse breath sounds bilaterally with crackles and some rhonchi. Diminished breath sounds posteriorly. Able to speak in full sentences, becomes somewhat fatigued, mild increased respiratory effort. Eyes: sclera clear, no exudate. Right eye lids, iris, pupil and sclera appear unremarkable Psychiatric: grossly normal mood and affect, speech fluent and appropriate  Data Reviewed:  Potassium is normalized, 4.1.  CBC stable, WBC 15.1.  Pertinent data: Labs  Influenza PCR negative  Urinalysis negative Imaging   Chest x-ray suggested atypical infection  CT chest suggested atypical infection. Other    Pending data:  Blood cultures no growth today  Scheduled Meds: . azithromycin  500 mg Intravenous Q24H  . buPROPion  300 mg Oral Daily  . cefTRIAXone (ROCEPHIN)  IV  1 g Intravenous Q24H  . DULoxetine  60 mg Oral Daily  . feeding supplement (ENSURE COMPLETE)  237 mL Oral BID BM  . heparin  5,000 Units Subcutaneous 3 times per day  . simvastatin  40 mg Oral q1800  . traZODone  100 mg Oral QHS  . trimethoprim-polymyxin b  1  drop Right Eye 4 times per day   Continuous Infusions:   Principal Problem:   Community acquired pneumonia Active Problems:   Acute respiratory failure with hypoxia   Nausea and vomiting   Hypokalemia   Conjunctivitis   Time spent 20 minutes

## 2014-03-02 MED ORDER — ALBUTEROL SULFATE HFA 108 (90 BASE) MCG/ACT IN AERS: 2.0000 | INHALATION_SPRAY | Freq: Four times a day (QID) | RESPIRATORY_TRACT | Status: AC | PRN

## 2014-03-02 MED ORDER — AZITHROMYCIN 250 MG PO TABS: 500.0000 mg | ORAL_TABLET | Freq: Every day | ORAL | Status: DC

## 2014-03-02 MED ORDER — CEFUROXIME AXETIL 500 MG PO TABS: 500.0000 mg | ORAL_TABLET | Freq: Two times a day (BID) | ORAL | Status: AC

## 2014-03-02 MED ORDER — POLYMYXIN B-TRIMETHOPRIM 10000-0.1 UNIT/ML-% OP SOLN: 1.0000 [drp] | Freq: Four times a day (QID) | OPHTHALMIC | Status: AC

## 2014-03-02 MED ORDER — CEFUROXIME AXETIL 250 MG PO TABS: 500.0000 mg | ORAL_TABLET | Freq: Two times a day (BID) | ORAL | Status: DC

## 2014-03-02 NOTE — Progress Notes (Signed)
O2 sat on room air at rest 90%. O2 sat ambulating on room air 87%. O2 sat ambulating on 2l/min 97%.

## 2014-03-02 NOTE — Progress Notes (Signed)
PROGRESS NOTE  [name] PillowJulia Ryan [id number] DOB: 03/31/1955 DOA: 02/26/2014 PCP: No primary care provider on file. from [place] Dr. Kristine Garbeevander Reddy [phone]   Summary: 59 year old woman with approximately one week history of productive cough, fever, nausea and vomiting for several days. Admitted for acute hypoxic respiratory failure and atypical pneumonia.  Assessment/Plan: 1. Acute hypoxic respiratory failure secondary to community-acquired pneumonia, much improved.  2. CAP, h/o yearly bronchitis. CT chest suggested atypical infection. Screening HIV negative. Improving daily . Has not been able to cough anything up, can't obtain sputum culture 3. N/V, resolved. Eating well. 4. Hypokalemia, resolved. Mg normal. 5. Right eye conjunctivitis without complicating features. Clinically resolved. 6. 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   Continues to improve, plan for discharge home today on home oxygen as arranged by care management  Finish oral abx  Finish ophthalmic abx  Brendia Sacksaniel Analena Gama, MD  [facility]  Pager [phone] If 7PM-7AM, please contact night-coverage at [email or web address], password [email or web address] 03/02/2014, 11:51 AM  LOS: 4 days   Consultants:    Procedures:    Antibiotics:  Azithromycin 1/22 >> 1/26  Ceftriaxone 1/21 >> 1/24  Ceftin 1/25 >> 1/27  Polytrim ophthalmic 1/22 >> 1/27  HPI/Subjective: Rash to upper back noted overnight. Hypoxic on ambulation.  Feels better, breathing a little better, no vomiting. Eating well. Non-productive cough. No eyelid matting.  Objective: Filed Vitals:   03/01/14 2018 03/01/14 2021 03/01/14 2025 03/02/14 0525  BP:    92/54  Pulse:    87  Temp:    98.6 F (37 C)  TempSrc:    Oral  Resp:    20  Height:      Weight:      SpO2: 94% 71% 97% 96%    Intake/Output Summary (Last 24 hours) at 03/02/14 1151 Last data filed at 03/02/14 0526  Gross per 24 hour  Intake    600 ml  Output   3800 ml  Net  -3200 ml      Filed Weights   02/26/14 1716 02/26/14 2300  Weight: 68.04 kg (150 lb) 69 kg (152 lb 1.9 oz)    Exam:     Afebrile, vitals stable. Hypoxia stable on 2 L. General:  Appears calm and comfortable. Appears better today. Eyes: right eye appears unremarkable, lid, iris and pupil appear normal, sclera clear Cardiovascular: RRR, no m/r/g.  Respiratory: good air movement, few rhonchi and wheezes, no rales. Fair air movement, speaks in full sentences. Skin: maculopapular discrete lesions over back upper > lower. None on chest, arms, legs or abdomen  Psychiatric: grossly normal mood and affect, speech fluent and appropriate  Data Reviewed:    Pertinent data: Labs  Influenza PCR negative  Urinalysis negative Imaging   Chest x-ray suggested atypical infection  CT chest suggested atypical infection. Other    Pending data:  Blood cultures no growth today  Scheduled Meds: . azithromycin  500 mg Intravenous Q24H  . buPROPion  300 mg Oral Daily  . cefTRIAXone (ROCEPHIN)  IV  1 g Intravenous Q24H  . DULoxetine  60 mg Oral Daily  . feeding supplement (ENSURE COMPLETE)  237 mL Oral BID BM  . heparin  5,000 Units Subcutaneous 3 times per day  . simvastatin  40 mg Oral q1800  . traZODone  100 mg Oral QHS  . trimethoprim-polymyxin b  1 drop Right Eye 4 times per day   Continuous Infusions:   Principal Problem:   Community acquired pneumonia Active Problems:   Acute respiratory failure with  hypoxia   Nausea and vomiting   Hypokalemia   Conjunctivitis

## 2014-03-02 NOTE — Progress Notes (Signed)
Discharge instruction reviewed with patient. No distress noted. Saline lock removed. Home O2 delivered to patient's room. Patient escorted to lobby via wheelchair.

## 2014-03-02 NOTE — Progress Notes (Signed)
Patient ambulated pass the nurse desk. Oxygen saturation room air before ambulation 95%. While ambulating in hallway oxygen saturation drop to 88% when coughing. Patient return to room oxygen saturation 92% on room air.

## 2014-03-02 NOTE — Discharge Summary (Signed)
Physician Discharge Summary  [name] Ryan BJY:[id number] DOB: 1956-01-09 DOA: 02/26/2014  PCP: No primary care provider on file. from [place] Dr. Kristine Garbe [phone] office  Admit date: 02/26/2014 Discharge date: 03/02/2014  Recommendations for Outpatient Follow-up:  1. Resolution of acute hypoxic respiratory failure, secondary to community acquired pneumonia 2. Currently on home oxygen at 2 L per minute nasal cannula, likely can be weaned in the next 2 weeks 3. Resolution of right eye conjunctivitis    Follow-up Information    Please follow up.   Why:  follow-up      Please follow up.   Why:  followup with you regular doctor in 7-10 days     Discharge Diagnoses:  1. Acute hypoxic respiratory failure 2. Community acquired pneumonia 3. Nausea and vomiting 4. Hypokalemia 5. Right eye conjunctivitis 6. 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 Admitted for acute hypoxic respiratory failure and atypical pneumonia  Hospital Course:  [name] Ryan was admitted and treated with antibiotics with slow improvement in pneumonia. Hypoxia stabilized, she will go home on home oxygen. She was treated also for right eye conjunctivitis without complicating features. Hospitalization was uncomplicated. See individual issues below.  1. Acute hypoxic respiratory failure secondary to community-acquired pneumonia, much improved.  2. CAP, h/o yearly bronchitis. CT chest suggested atypical infection. Screening HIV negative. Improving daily . Has not been able to cough anything up, can't obtain sputum culture 3. N/V, resolved. Eating well. 4. Hypokalemia, resolved.  Mg normal. 5. Right eye conjunctivitis without complicating features. Clinically resolved. 6. 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   Continues to improve, plan for discharge home today on home oxygen as arranged by care management. Oxygen has been arranged for her transport back to [place].  Finish oral abx  Finish ophthalmic abx   Consultants:  none  Procedures:  none  Antibiotics:  Azithromycin 1/22 >> 1/26  Ceftriaxone 1/21 >> 1/24  Ceftin 1/25 >> 1/27  Polytrim ophthalmic 1/22 >> 1/27  Discharge Instructions  Discharge Instructions    Diet general    Complete by:  As directed      Discharge instructions    Complete by:  As directed   Call your physician or seek immediate medical attention for increased shortness of breath, fever, wheezing, fatigue or worsening of condition.     Increase activity slowly    Complete by:  As directed           Current Discharge Medication List    START taking these medications   Details  albuterol (PROVENTIL HFA;VENTOLIN HFA) 108 (90 BASE) MCG/ACT inhaler Inhale 2 puffs into the lungs every 6 (six) hours as needed for wheezing or shortness of breath. Qty: 1 Inhaler, Refills: 0    cefUROXime (CEFTIN) 500 MG tablet Take 1 tablet (500 mg total) by mouth 2 (two) times daily with a meal. Qty: 5 tablet, Refills: 0    trimethoprim-polymyxin b (POLYTRIM) ophthalmic solution Place 1 drop into the right eye every 6 (six) hours. Take through 1/27 then stop. Qty: 10 mL, Refills: 0      CONTINUE these medications which have NOT CHANGED   Details  azithromycin (ZITHROMAX) 250 MG tablet  Take 250-500 mg by mouth daily. Started on 02/25/14    buPROPion (WELLBUTRIN XL) 300 MG 24 hr tablet Take 300 mg by mouth daily.    DULoxetine (CYMBALTA) 60 MG capsule Take 60 mg by mouth daily.    FLOVENT HFA 110 MCG/ACT inhaler Inhale 2 puffs into the lungs daily. Refills: 1    promethazine (PHENERGAN) 25 MG suppository Place 25 mg  rectally every 8 (eight) hours as needed for nausea or vomiting.    simvastatin (ZOCOR) 40 MG tablet Take 40 mg by mouth daily.    traZODone (DESYREL) 100 MG tablet Take 100 mg by mouth at bedtime.       Allergies  Allergen Reactions  . Erythromycin Nausea And Vomiting    The results of significant diagnostics from this hospitalization (including imaging, microbiology, ancillary and laboratory) are listed below for reference.    Significant Diagnostic Studies: Dg Chest 2 View  02/26/2014   CLINICAL DATA:  Weakness, vomiting and fever.  EXAM: CHEST  2 VIEW  COMPARISON:  PA and lateral chest 02/25/2014.  FINDINGS: Reticulonodular opacities are seen throughout both lungs. No consolidative process is identified. Mild peribronchial thickening is seen. No focal airspace disease, pneumothorax or effusion is identified. Heart size is normal.  IMPRESSION: Reticulonodular opacities suggestive atypical infection with bronchitic change also identified.   Electronically Signed   By: Drusilla Kanner M.D.   On: 02/26/2014 19:34   Ct Chest W Contrast  02/26/2014   CLINICAL DATA:  Pt. Has a one-week history of cough productive of green/yellow sputum associated with fever. She also has had nausea and vomiting for the last few days without abdominal pain. She is an ex-smoker. Her chest x-ray is abnormal  EXAM: CT CHEST WITH CONTRAST  TECHNIQUE: Multidetector CT imaging of the chest was performed during intravenous contrast administration.  CONTRAST:  80mL OMNIPAQUE IOHEXOL 300 MG/ML  SOLN  COMPARISON:  Chest radiograph, 02/26/2014.  FINDINGS: Heterogeneous nodules enlarged left thyroid lobe deviating the upper thoracic trachea to the right. Thoracic inlet otherwise unremarkable. No axillary masses or adenopathy.  Heart is normal in size and configuration. Left coronary artery stent. Great vessels are normal in caliber.  No pathologically enlarged mediastinal or hilar lymph nodes.  There is bilateral interstitial  thickening. Ill-defined small reticular nodular opacities are noted in the lower lobes. More confluent opacity is noted in the posterior lung bases. Small areas of ground-glass opacity are noted in the right upper lobe. No pleural effusion.  Limited evaluation of the upper abdomen shows small gallstones, incompletely imaged and left upper pole renal scarring.  Minor compression fracture of the upper endplate of L1 of unclear chronicity but likely old. No osteoblastic or osteolytic lesions.  IMPRESSION: 1. Bilateral interstitial thickening has noted on the current chest radiograph. Additional small ill-defined reticular nodular opacities are noted in both lower lobes and there are small areas of ground-glass opacity in the right upper lobe. As noted on the current chest radiograph, atypical or viral infection is suspected. Additional posterior lung base opacity is noted that is likely mostly due to atelectasis.   Electronically Signed   By: Amie Portland M.D.   On: 02/26/2014 22:33    Microbiology: Recent Results (from the past 240 hour(s))  Culture, blood (routine x 2) Call MD if unable to obtain prior to antibiotics being given     Status: None (Preliminary result)   Collection Time: 02/26/14 10:35 PM  Result Value Ref Range Status   Specimen Description BLOOD LEFT HAND  Final   Special Requests BOTTLES DRAWN AEROBIC AND ANAEROBIC [facility]6CC EACH  Final   Culture NO GROWTH 4 DAYS  Final   Report Status PENDING  Incomplete  Culture, blood (routine x 2) Call MD if unable to obtain prior to antibiotics being given     Status: None (Preliminary result)   Collection Time: 02/26/14 10:35 PM  Result Value Ref Range Status   Specimen Description BLOOD RIGHT HAND  Final   Special Requests BOTTLES DRAWN AEROBIC AND ANAEROBIC 6CC EACH  Final   Culture NO GROWTH 4 DAYS  Final   Report Status PENDING  Incomplete  Clostridium Difficile by PCR     Status: None   Collection Time: 02/27/14  2:45 PM  Result Value Ref  Range Status   C difficile by pcr NEGATIVE NEGATIVE Final     Labs: Basic Metabolic Panel:  Recent Labs Lab 02/26/14 1800 02/27/14 0608 02/27/14 0752 02/28/14 0519 02/28/14 0821 03/01/14 0530  NA 140 141  --   --  141 143  K 3.0* 2.8*  --   --  2.7* 4.1  CL 102 110  --   --  105 107  CO2 28 25  --   --  26 28  GLUCOSE 118* 107*  --   --  99 107*  BUN 9 <5*  --   --  <5* 6  CREATININE 0.82 0.74  --   --  0.80 0.77  CALCIUM 8.8 7.6*  --   --  8.2* 8.6  MG  --   --  1.6 1.8  --   --    Liver Function Tests:  Recent Labs Lab 02/26/14 1800 02/27/14 0608  AST 33 34  ALT 42* 35  ALKPHOS 116 89  BILITOT 0.5 0.5  PROT 7.7 5.8*  ALBUMIN 3.2* 2.3*    Recent Labs Lab 02/26/14 1800  LIPASE 19   CBC:  Recent Labs Lab 02/26/14 1800 02/27/14 0608 03/01/14 0530  WBC 10.6* 12.5* 15.1*  NEUTROABS 7.3  --   --   HGB 13.8 12.5 11.7*  HCT 42.5 37.9 35.8*  MCV 87.8 88.3 88.6  PLT 275 259 310    Principal Problem:   Community acquired pneumonia Active Problems:   Acute respiratory failure with hypoxia   Nausea and vomiting   Hypokalemia   Conjunctivitis   Time coordinating discharge: 35 minutes  Signed:  Brendia Sacksaniel Andera Cranmer, MD [facility] 03/02/2014, 2:25 PM

## 2014-03-02 NOTE — Progress Notes (Signed)
Upon assessment, a rash was noted on the patient's upper back. Patient states it has been itching for a few days. Patient is not sure where it could be from. Cream was applied to relieve itching. Patient is now comfortable. Will continue to monitor.

## 2014-03-03 LAB — CULTURE, BLOOD (ROUTINE X 2)
CULTURE: NO GROWTH
CULTURE: NO GROWTH

## 2014-03-03 LAB — LEGIONELLA ANTIGEN, URINE

## 2016-08-08 IMAGING — DX DG CHEST 2V
2 series · 2 of 2 positions shown · non-contrast
Comparison: PA and lateral chest 02/25/2014.

CLINICAL DATA: Weakness, vomiting and fever.

EXAM:
CHEST  2 VIEW

[chest lat]
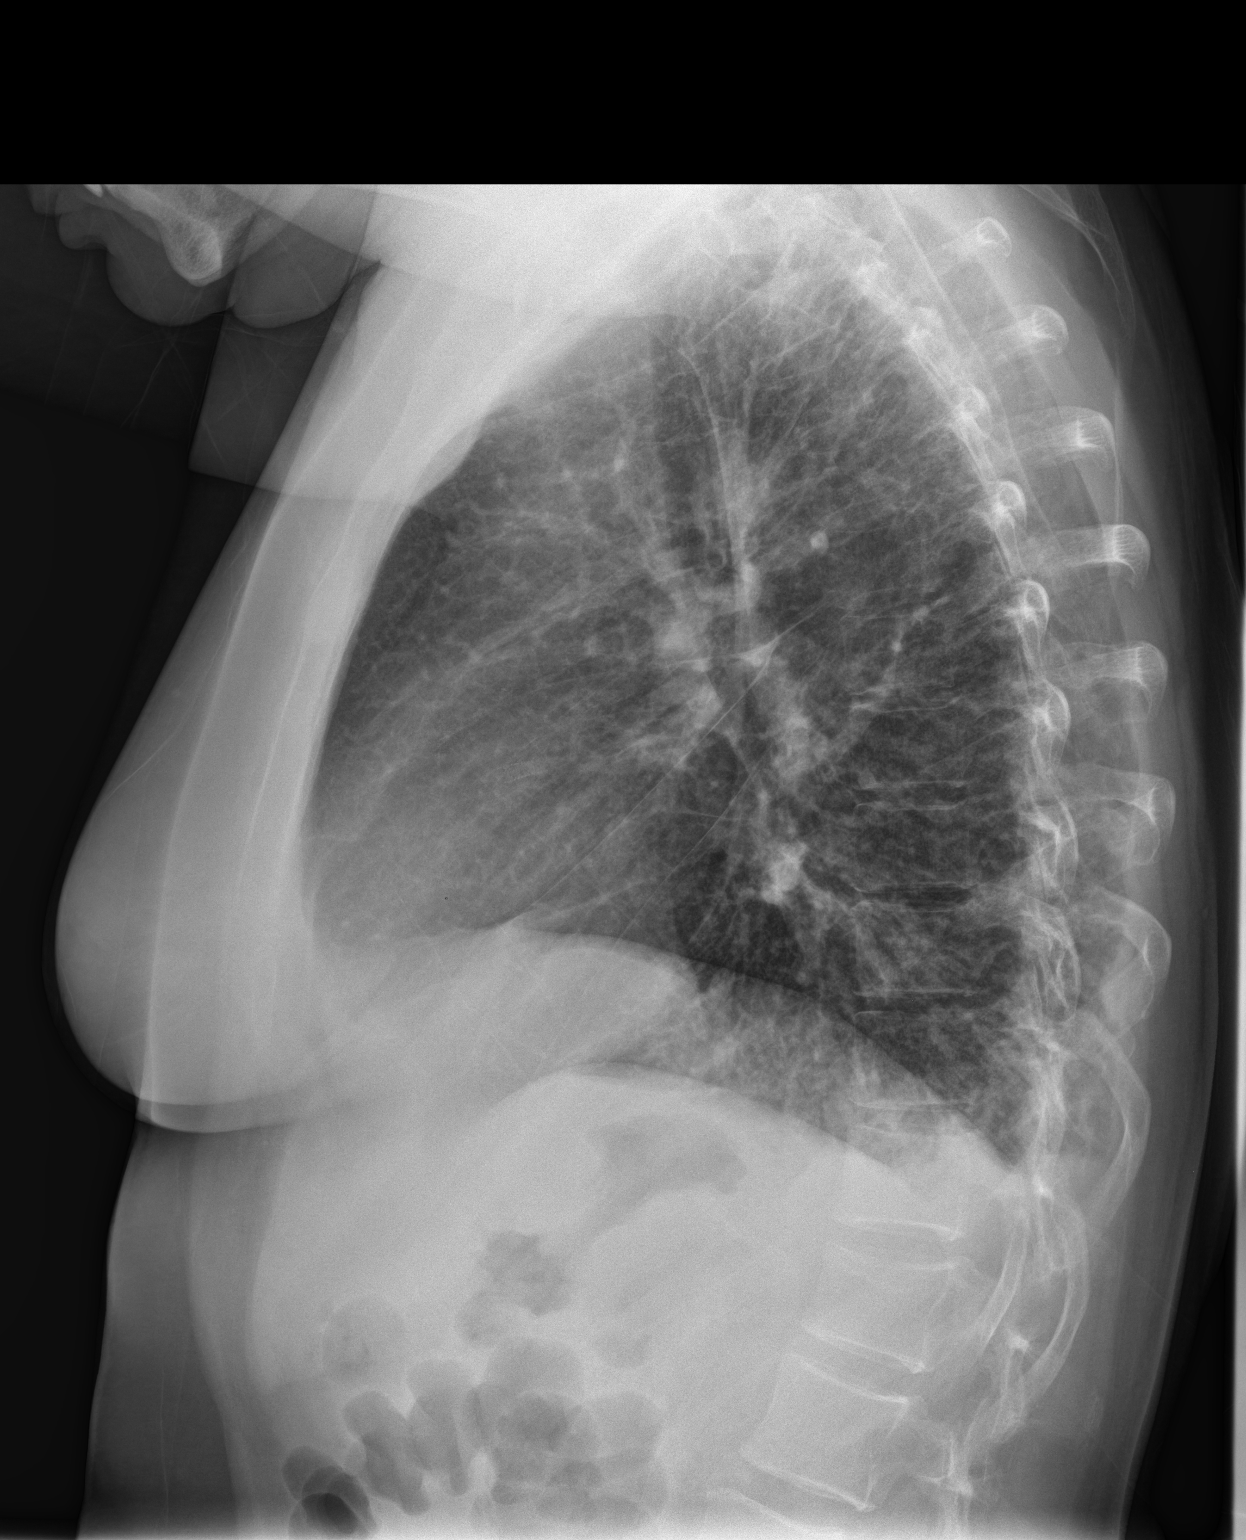

[chest ap]
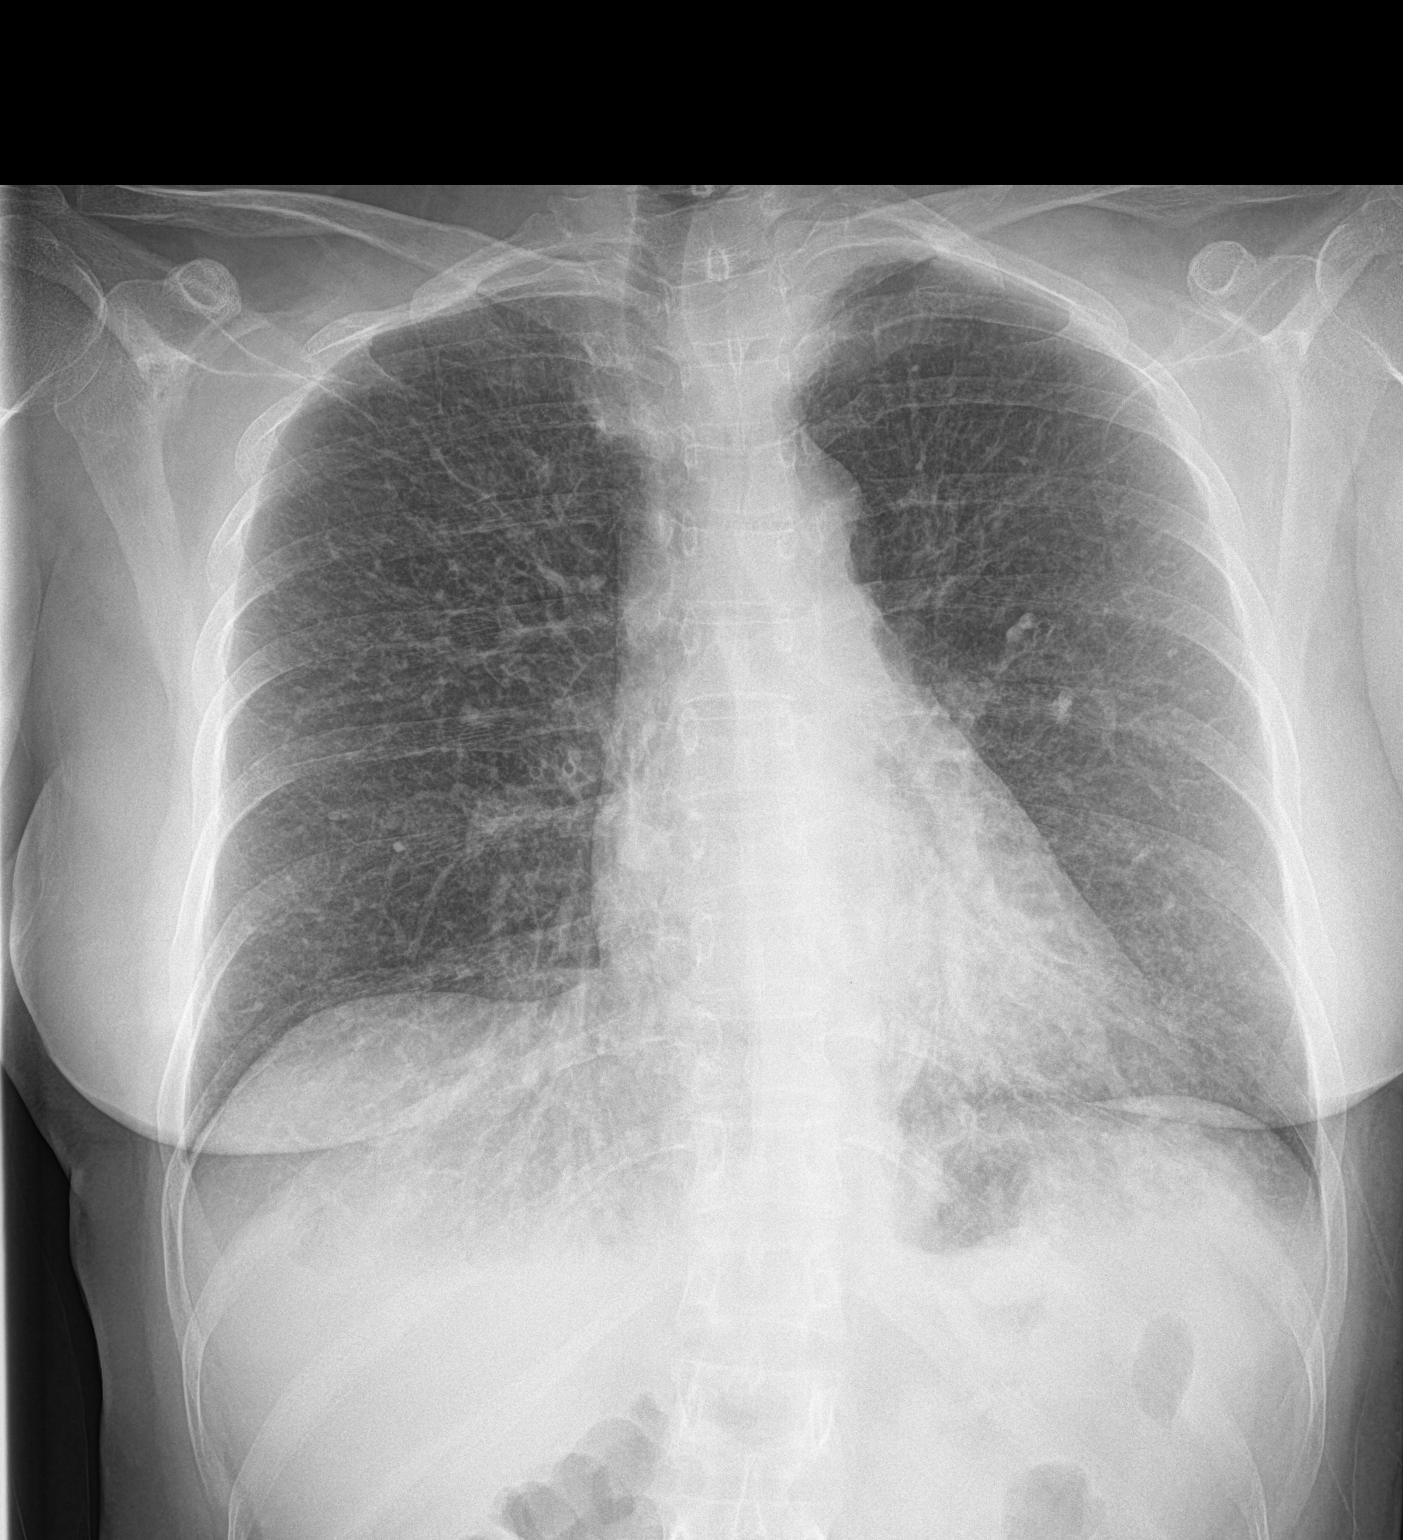

[2 of 2 positions shown; findings below may reference images not displayed]

FINDINGS: Reticulonodular opacities are seen throughout both lungs. No
consolidative process is identified. Mild peribronchial thickening
is seen. No focal airspace disease, pneumothorax or effusion is
identified. Heart size is normal.
IMPRESSION: Reticulonodular opacities suggestive atypical infection with
bronchitic change also identified.
